# Patient Record
Sex: Female | Born: 1985 | Race: White | Hispanic: No | Marital: Single
Health system: Southern US, Community
[De-identification: ages and names within clinical notes are randomized; demographics above are authoritative.]

---

## 2006-09-10 ENCOUNTER — Emergency Department (HOSPITAL_COMMUNITY): Admission: EM | Admit: 2006-09-10 | Discharge: 2006-09-11 | Payer: Self-pay | Admitting: Emergency Medicine

## 2007-04-19 IMAGING — US US OB COMP LESS 14 WK
1 series · 14 of 24 positions shown · non-contrast
Comparison: None.

CLINICAL DATA: First trimester pregnancy.  The patient fell and is vomiting blood.  
OBSTETRICAL ULTRASOUND <14 WKS:
TECHNIQUE: Transabdominal ultrasound was performed for evaluation of the gestation as well as the maternal uterus and adnexal regions.

[Series 1: unknown · 0.32mm/px · 14 of 24 slices shown]
[im 1/24]
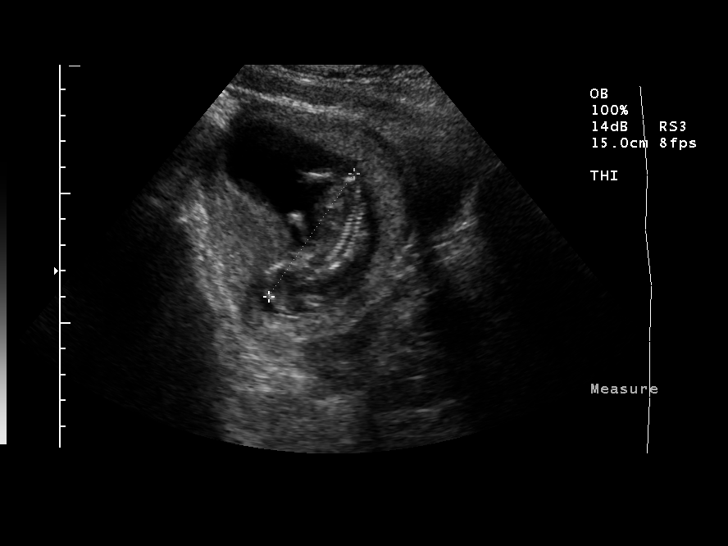
[im 3/24]
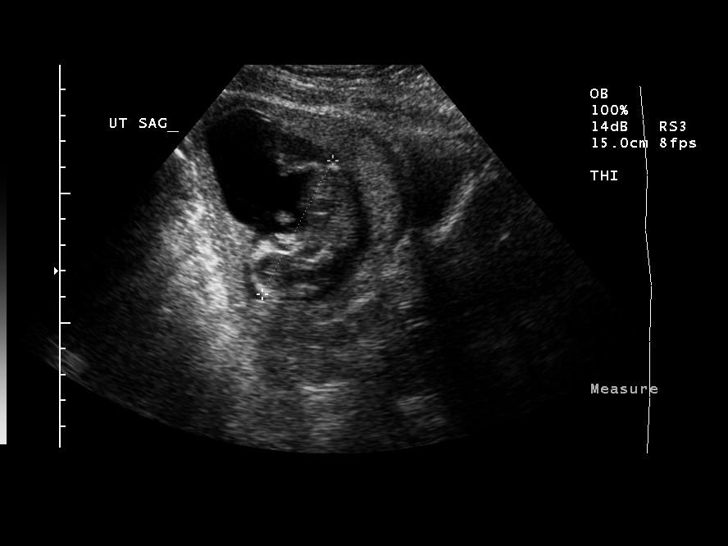
[im 5/24]
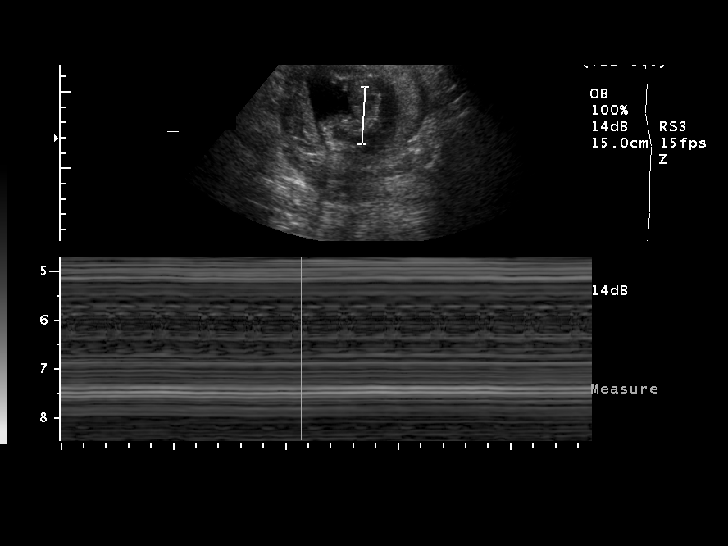
[im 7/24]
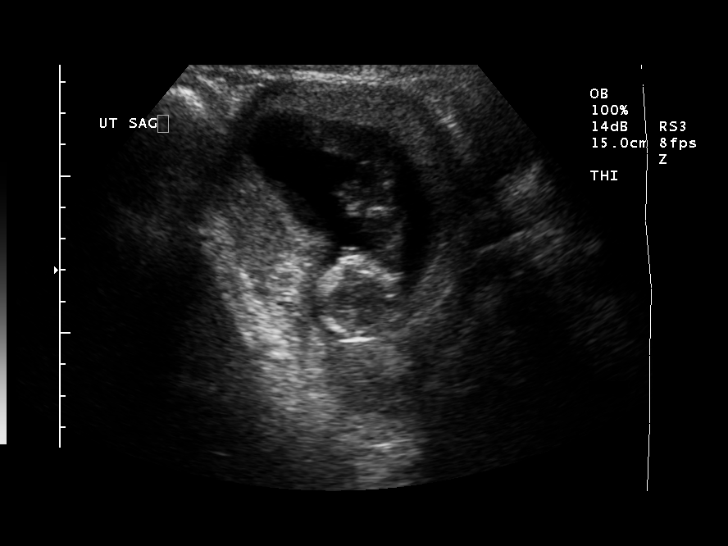
[im 8/24]
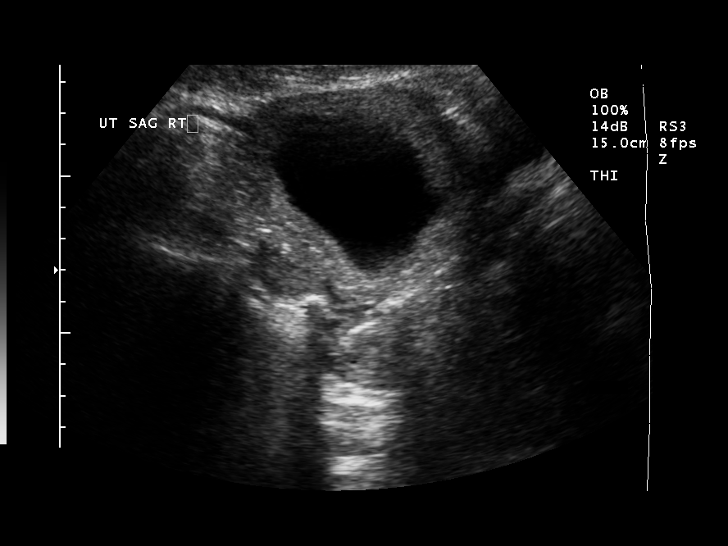
[im 10/24]
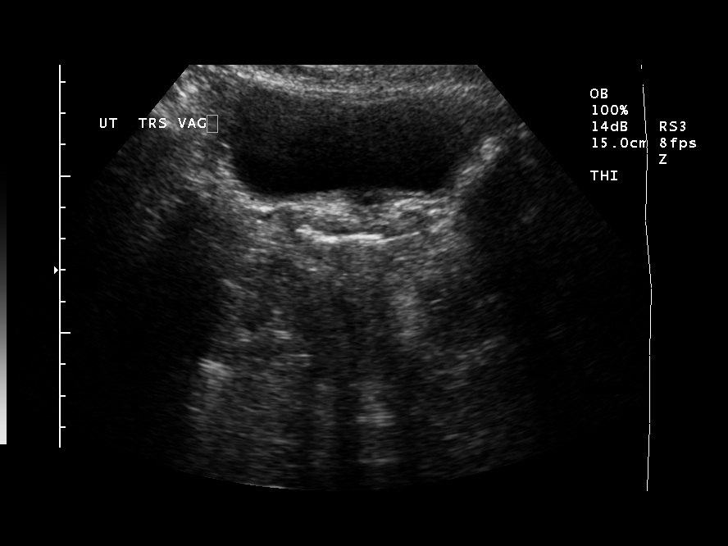
[im 12/24]
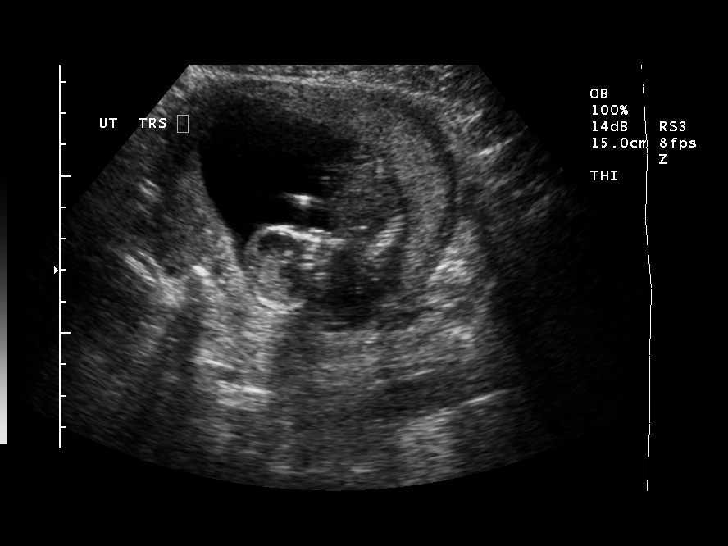
[im 13/24]
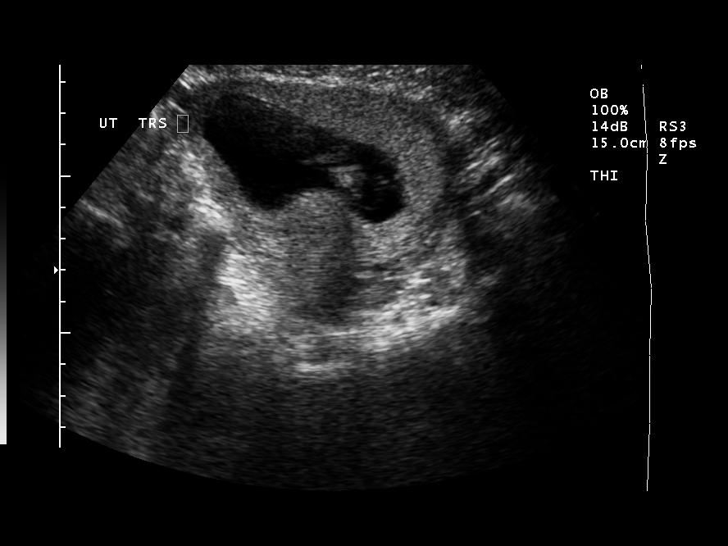
[im 15/24]
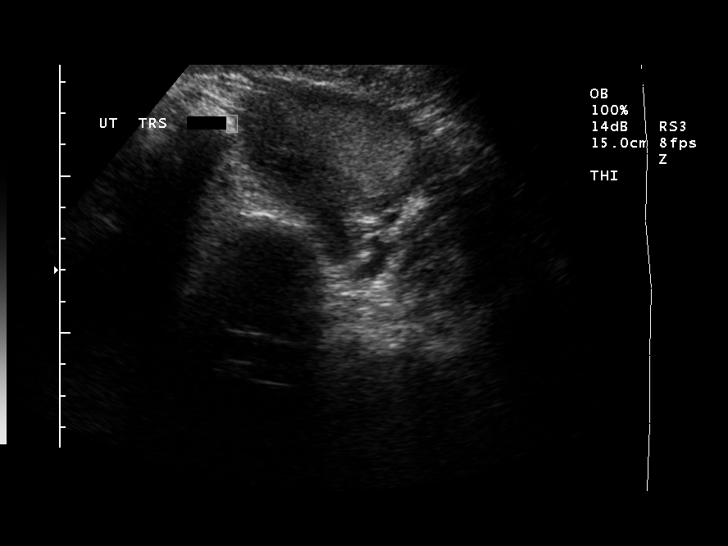
[im 17/24]
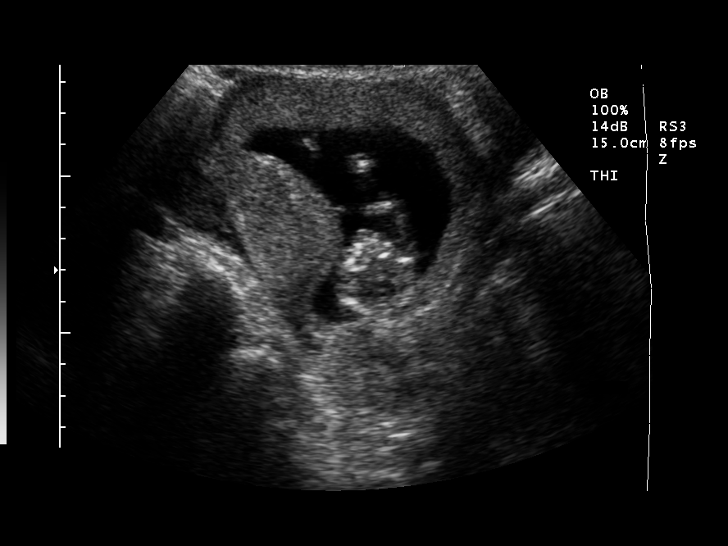
[im 19/24]
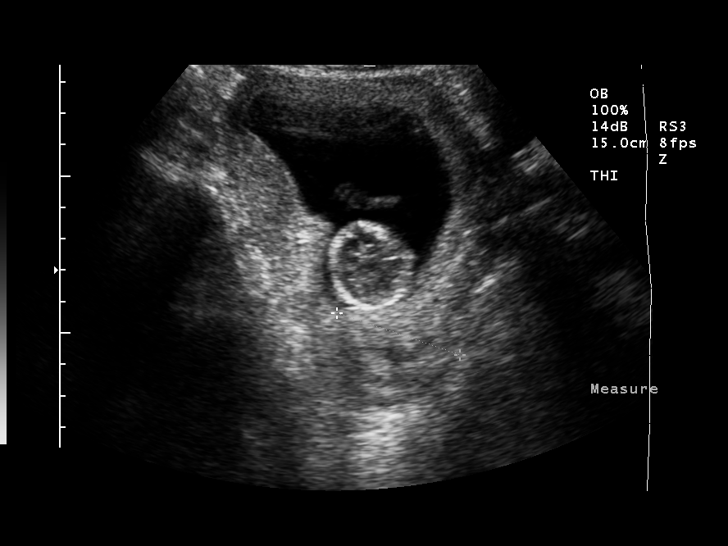
[im 20/24]
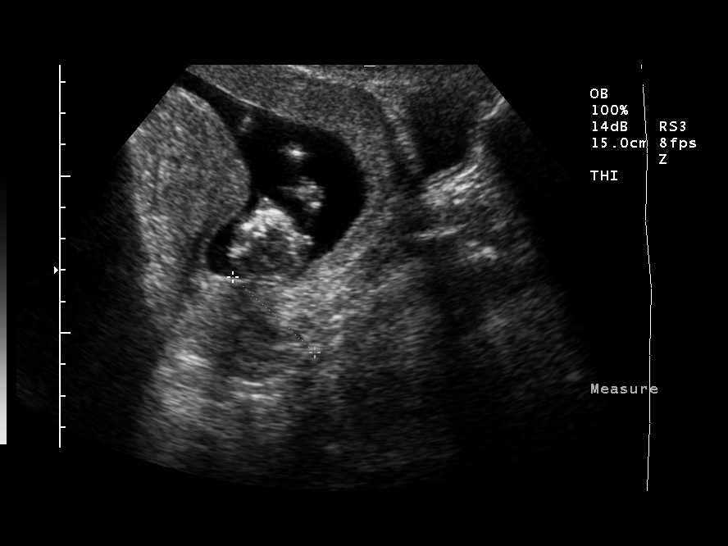
[im 22/24]
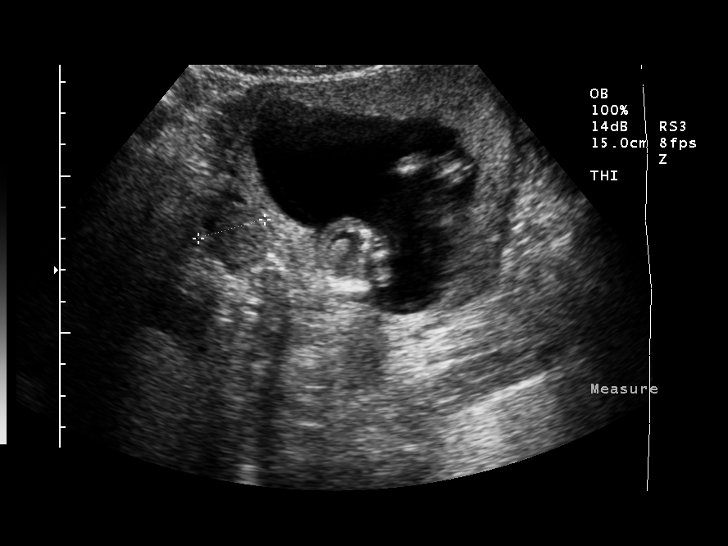
[im 24/24]
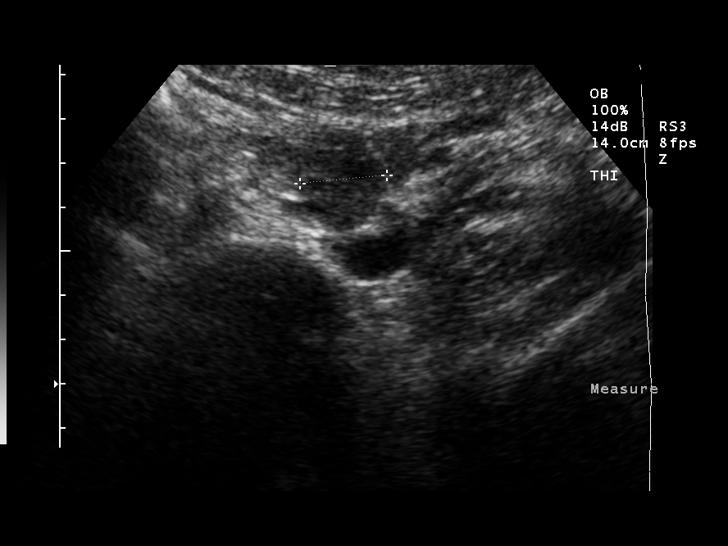

[14 of 24 positions shown; findings below may reference images not displayed]

FINDINGS: The patient's LMP is 06/17/06.  There is a single live intrauterine gestation in cephalic presentation.  This has a crown-rump length of 57.8 mm, corresponding with a gestational age of 12 weeks 2 days.  There is fetal cardiac motion at 145 bpm.  No fetal abnormalities are seen.  There is no evidence of placental hemorrhage.  
Both maternal ovaries are visualized and appear normal.  There is no free pelvic fluid.
IMPRESSION: 1.  Single live intrauterine gestation with best estimated gestational age of 12 weeks 2 days based on crown-rump length.  
2.  No fetal or placental abnormalities are demonstrated.

## 2021-07-17 ENCOUNTER — Telehealth: Payer: Self-pay

## 2021-07-17 NOTE — Telephone Encounter (Signed)
Created in error
